# Patient Record
Sex: Male | Born: 2000
Health system: Southern US, Community
[De-identification: ages and names within clinical notes are randomized; demographics above are authoritative.]

## PROBLEM LIST (undated history)

## (undated) DIAGNOSIS — J45909 Unspecified asthma, uncomplicated: Secondary | ICD-10-CM

## (undated) HISTORY — PX: WISDOM TOOTH EXTRACTION: SHX21

---
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
## 2021-01-30 IMAGING — DX DG CHEST 2V
2 series · 2 of 2 positions shown · non-contrast
Comparison: 04/22/2003

CLINICAL DATA: Bradycardia

EXAM:
CHEST - 2 VIEW

[chest pa]
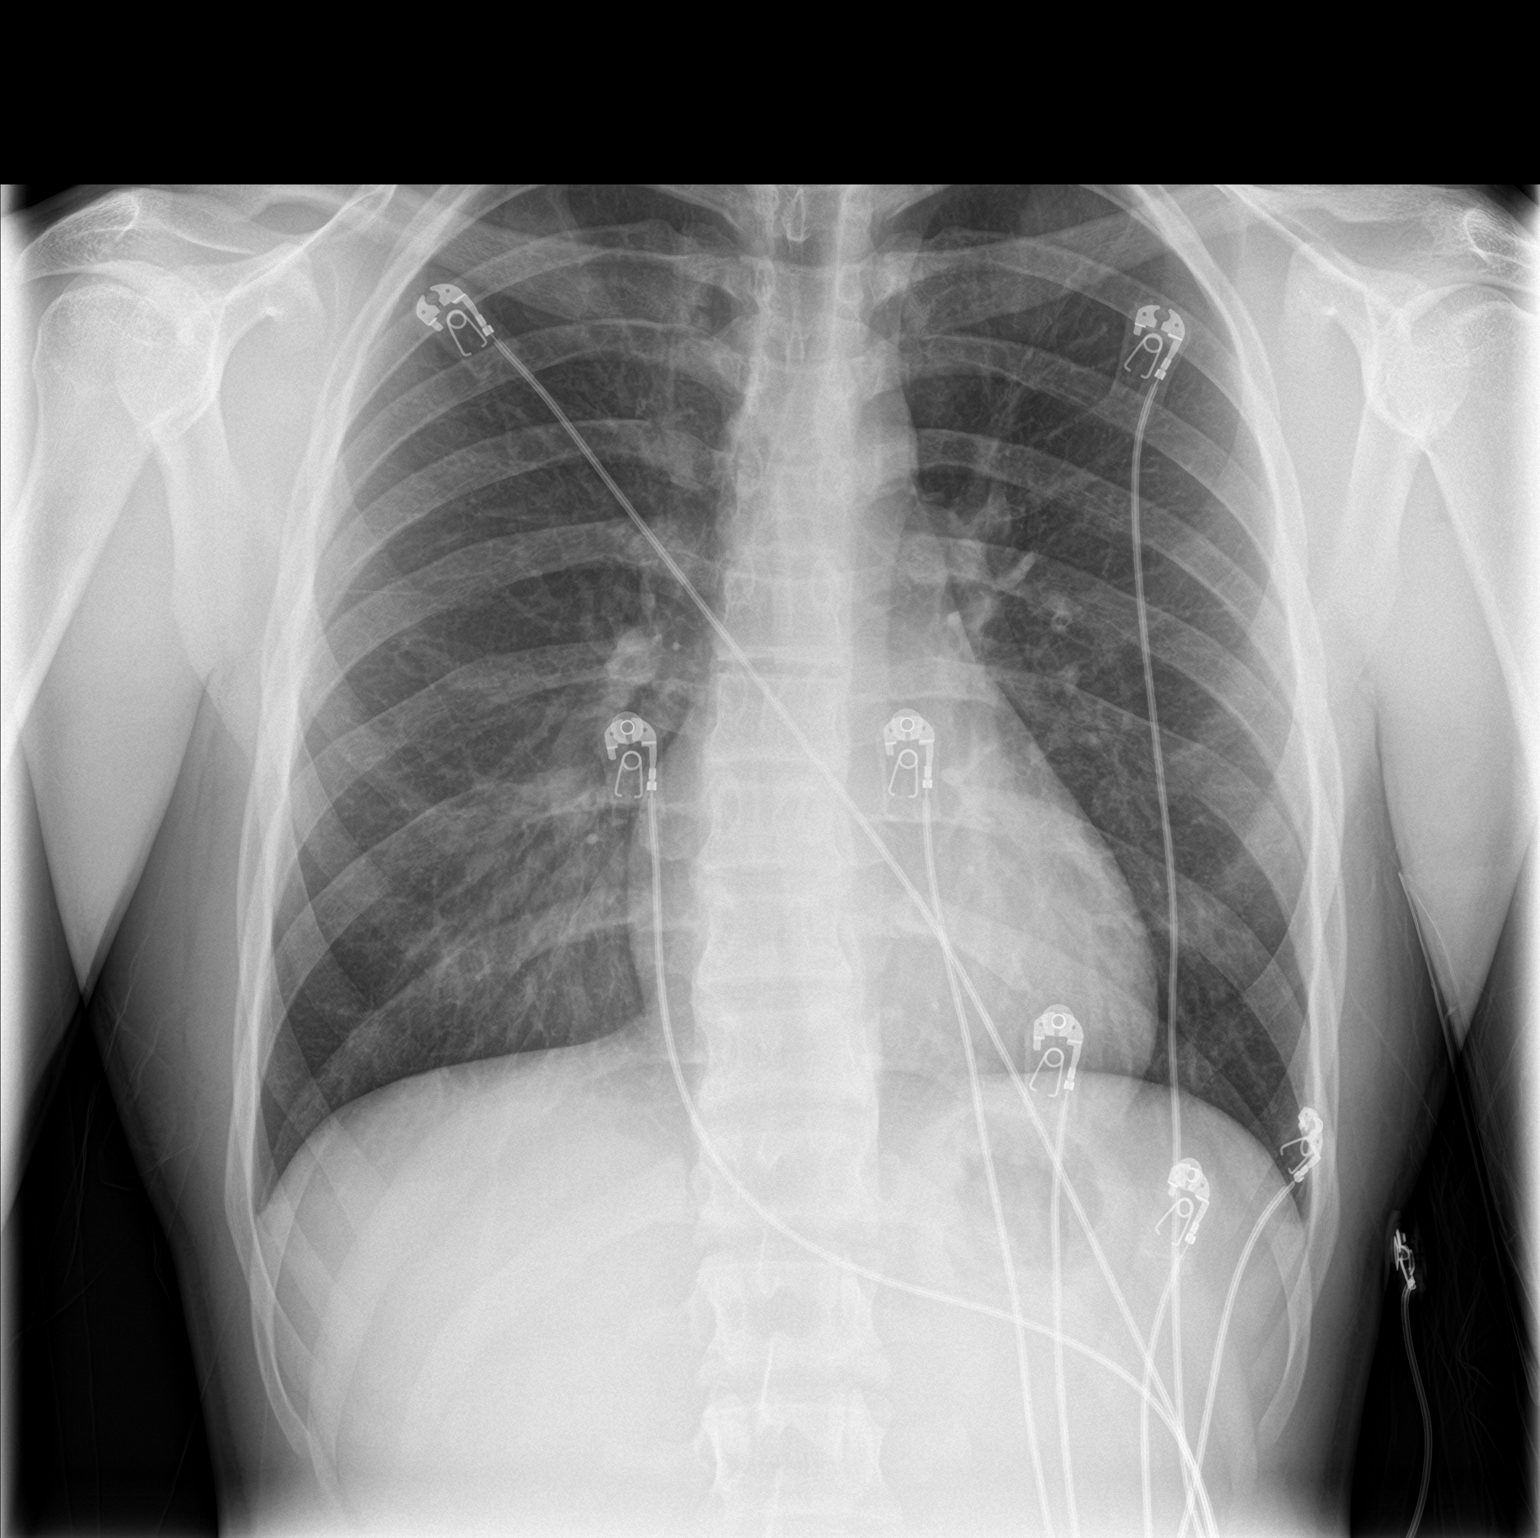

[chest lat]
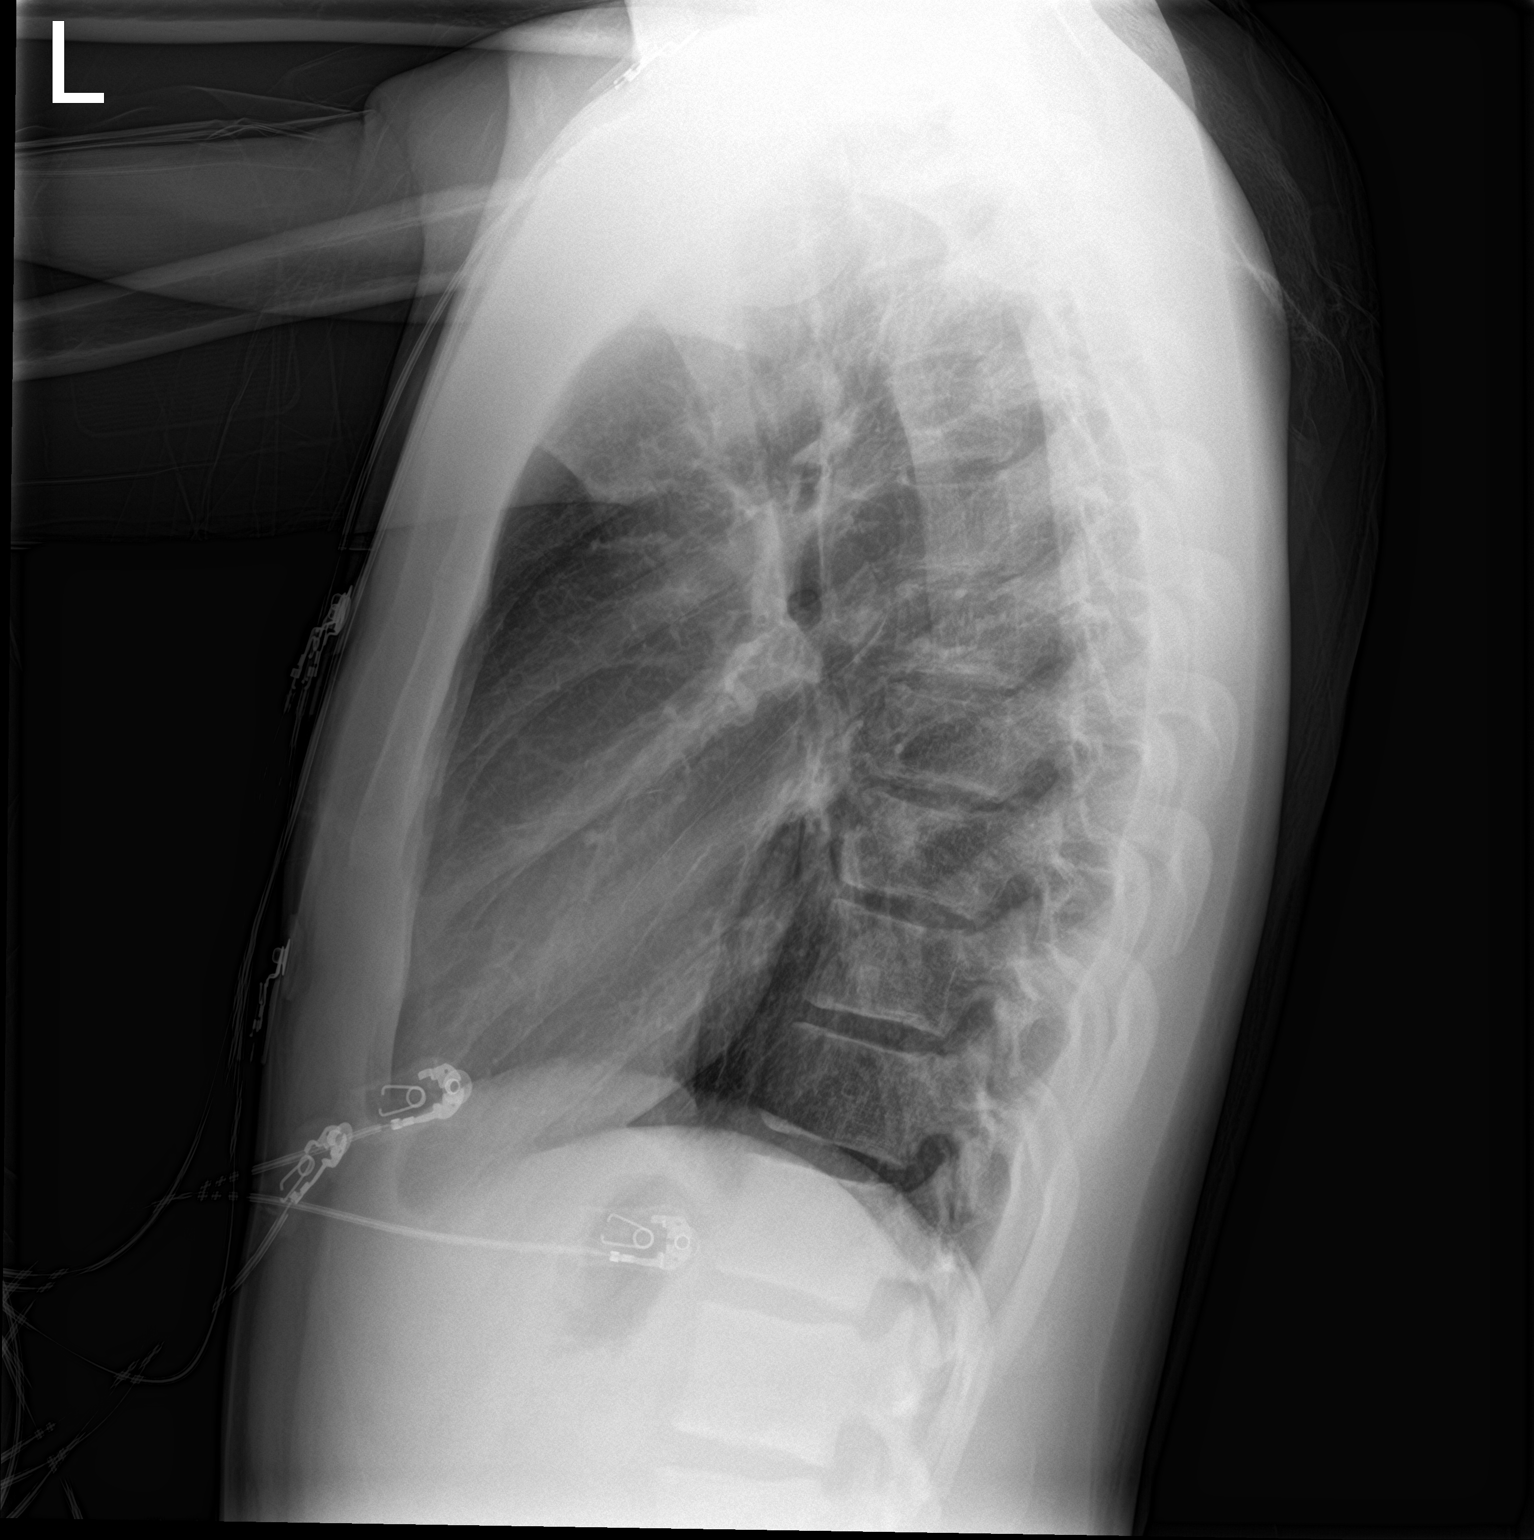

[2 of 2 positions shown; findings below may reference images not displayed]

FINDINGS: The heart size and mediastinal contours are within normal limits.
Both lungs are clear. The visualized skeletal structures are
unremarkable.
IMPRESSION: No active cardiopulmonary disease.
